# Patient Record
Sex: Female | Born: 1985 | Smoking: Never smoker
Health system: Southern US, Community
[De-identification: ages and names within clinical notes are randomized; demographics above are authoritative.]

## PROBLEM LIST (undated history)

## (undated) DIAGNOSIS — E079 Disorder of thyroid, unspecified: Secondary | ICD-10-CM

## (undated) HISTORY — PX: WISDOM TOOTH EXTRACTION: SHX21

---

## 2020-05-12 ENCOUNTER — Other Ambulatory Visit: Payer: Self-pay | Admitting: Family

## 2020-05-12 DIAGNOSIS — R102 Pelvic and perineal pain: Secondary | ICD-10-CM

## 2020-05-13 ENCOUNTER — Ambulatory Visit
Admission: RE | Admit: 2020-05-13 | Discharge: 2020-05-13 | Disposition: A | Discharge status: Home / Self Care | Payer: Managed Care, Other (non HMO) | Source: Ambulatory Visit | Attending: Family | Admitting: Family

## 2020-05-13 ENCOUNTER — Other Ambulatory Visit: Payer: Self-pay

## 2020-05-13 DIAGNOSIS — R102 Pelvic and perineal pain: Secondary | ICD-10-CM | POA: Insufficient documentation

## 2020-06-20 ENCOUNTER — Encounter: Payer: Self-pay | Admitting: Emergency Medicine

## 2020-06-20 ENCOUNTER — Other Ambulatory Visit: Payer: Self-pay

## 2020-06-20 ENCOUNTER — Ambulatory Visit
Admission: EM | Admit: 2020-06-20 | Discharge: 2020-06-20 | Disposition: A | Discharge status: Home / Self Care | Payer: Managed Care, Other (non HMO)

## 2020-06-20 DIAGNOSIS — W57XXXA Bitten or stung by nonvenomous insect and other nonvenomous arthropods, initial encounter: Secondary | ICD-10-CM | POA: Diagnosis not present

## 2020-06-20 DIAGNOSIS — S40861A Insect bite (nonvenomous) of right upper arm, initial encounter: Secondary | ICD-10-CM

## 2020-06-20 DIAGNOSIS — Z9189 Other specified personal risk factors, not elsewhere classified: Secondary | ICD-10-CM | POA: Diagnosis not present

## 2020-06-20 HISTORY — DX: Disorder of thyroid, unspecified: E07.9

## 2020-06-20 MED ORDER — DOXYCYCLINE HYCLATE 100 MG PO CAPS: 100.0000 mg | ORAL_CAPSULE | Freq: Two times a day (BID) | ORAL | 0 refills | Status: AC

## 2020-06-20 NOTE — ED Triage Notes (Signed)
Patient states that she removed a tick from her upper right arm this morning.  Patient states that she is have some aching in her right arm.

## 2020-06-20 NOTE — ED Provider Notes (Addendum)
[facility]    CSN: [id number] Arrival date & time: 06/20/20  1006      History   Chief Complaint Chief Complaint  Patient presents with   Insect Bite    tick    HPI Sabrina Flores is a 35 y.o. female.   HPI  35 year old female here for evaluation of tick bite.  Patient reports that she noticed a brown tick attached to the posterior aspect of her right upper arm that she removed this morning.  She did remove the tick intact and has brought it with her.  The tick appears to be a dog tick.  She is concerned because she is feeling aching in her right arm and occasionally will have tingling in all of her fingers.  She denies any weakness or changes in mobility.  She is here out of an abundance of precaution.  Past Medical History:  Diagnosis Date   Thyroid disease     There are no problems to display for this patient.   Past Surgical History:  Procedure Laterality Date   WISDOM TOOTH EXTRACTION      OB History   No obstetric history on file.      Home Medications    Prior to Admission medications   Medication Sig Start Date End Date Taking? Authorizing Provider  doxycycline (VIBRAMYCIN) 100 MG capsule Take 1 capsule (100 mg total) by mouth 2 (two) times daily. 06/20/20  Yes Becky Augusta, NP  levothyroxine (SYNTHROID) 88 MCG tablet One tab mon to fri 2 on sat and sunday 05/22/20  Yes [provider]  metFORMIN (GLUCOPHAGE-XR) 500 MG 24 hr tablet Take 500 mg by mouth daily. 06/17/20  Yes [provider]  Prenatal Vit-Fe Fumarate-FA (PNV PRENATAL PLUS MULTIVITAMIN) 27-1 MG TABS Take 1 tablet by mouth daily.   Yes [provider]    Family History History reviewed. No pertinent family history.  Social History Social History   Tobacco Use   Smoking status: Never   Smokeless tobacco: Never  Vaping Use   Vaping Use: Never used  Substance Use Topics   Alcohol use: Never   Drug use: Never     Allergies   Sulfa  antibiotics, Metformin, and Naproxen   Review of Systems Review of Systems  Constitutional:  Negative for activity change, appetite change and fever.  Musculoskeletal:  Positive for myalgias. Negative for arthralgias and joint swelling.  Skin:  Negative for rash.  Neurological:  Negative for headaches.  Hematological: Negative.   Psychiatric/Behavioral: Negative.      Physical Exam Triage Vital Signs ED Triage Vitals  Enc Vitals Group     BP --      Pulse --      Resp --      Temp --      Temp src --      SpO2 --      Weight 06/20/20 1016 135 lb (61.2 kg)     Height 06/20/20 1016 5\' 4"  (1.626 m)     Head Circumference --      Peak Flow --      Pain Score 06/20/20 1015 3     Pain Loc --      Pain Edu? --      Excl. in GC? --    No data found.  Updated Vital Signs BP 109/77 (BP Location: Left Arm)   Pulse 71   Temp 98.3 F (36.8 C) (Oral)   Resp 14   Ht 5\' 4"  (  1.626 m)   Wt 135 lb (61.2 kg)   SpO2 100%   Breastfeeding No   BMI 23.17 kg/m   Visual Acuity Right Eye Distance:   Left Eye Distance:   Bilateral Distance:    Right Eye Near:   Left Eye Near:    Bilateral Near:     Physical Exam Vitals and nursing note reviewed.  Constitutional:      General: She is not in acute distress.    Appearance: Normal appearance. She is normal weight. She is not ill-appearing.  HENT:     Head: Normocephalic and atraumatic.  Musculoskeletal:        General: No swelling, tenderness or deformity.  Skin:    General: Skin is warm and dry.     Capillary Refill: Capillary refill takes less than 2 seconds.     Findings: No bruising, erythema, lesion or rash.  Neurological:     General: No focal deficit present.     Mental Status: She is alert and oriented to person, place, and time.     Sensory: No sensory deficit.     Motor: No weakness.  Psychiatric:        Mood and Affect: Mood normal.        Behavior: Behavior normal.        Thought Content: Thought content  normal.        Judgment: Judgment normal.     UC Treatments / Results  Labs (all labs ordered are listed, but only abnormal results are displayed) Labs Reviewed - No data to display  EKG   Radiology No results found.  Procedures Procedures (including critical care time)  Medications Ordered in UC Medications - No data to display  Initial Impression / Assessment and Plan / UC Course  I have reviewed the triage vital signs and the nursing notes.  Pertinent labs & imaging results that were available during my care of the patient were reviewed by me and considered in my medical decision making (see chart for details).  Patient is a very pleasant, nontoxic-appearing 35 year old female here for evaluation after finding a tick on her this morning.  She is unsure of the duration she says it could have been several hours overnight.  There is no redness at the site.  The tick has been removed intact that she has not with her.  There is no rash, joint pain, headache, fever, or malaise.  Patient does report that her right arm aches and she will occasionally get tingling in her fingers.  Patient has 5/5 bilateral grips and upper extremity strength.  There is no pain with passive or active range of motion of any of the joints of her right arm.  There is no localized erythema or histamine response at the site of the bite.  Out of an abundance of precaution will cover patient prophylactically with doxycycline.   Final Clinical Impressions(s) / UC Diagnoses   Final diagnoses:  At high risk for tick borne illness  Tick bite of right upper arm, initial encounter     Discharge Instructions      Take the doxycycline twice daily with food for 10 days.  The doxycycline will make you more photosensitive and prone to sunburn make sure that you are wearing sunscreen when you are outdoors and make sure that you are applying it to your ears and also the part of your hair.  Reapply the sunscreen every  2 hours while outdoors.  If you develop any rashes,  joint pain, fever, muscle pain, or fatigue return for reevaluation.     ED Prescriptions     Medication Sig Dispense Auth. Provider   doxycycline (VIBRAMYCIN) 100 MG capsule Take 1 capsule (100 mg total) by mouth 2 (two) times daily. 20 capsule Becky Augusta, NP      PDMP not reviewed this encounter.   Becky Augusta, NP 06/20/20 1038    Becky Augusta, NP 06/20/20 1042

## 2020-06-20 NOTE — Discharge Instructions (Addendum)
Take the doxycycline twice daily with food for 10 days.  The doxycycline will make you more photosensitive and prone to sunburn make sure that you are wearing sunscreen when you are outdoors and make sure that you are applying it to your ears and also the part of your hair.  Reapply the sunscreen every 2 hours while outdoors.  If you develop any rashes, joint pain, fever, muscle pain, or fatigue return for reevaluation.

## 2022-04-28 IMAGING — US US PELVIS COMPLETE WITH TRANSVAGINAL
1 series · 14 of 25 positions shown · non-contrast
Comparison: None

CLINICAL DATA: Pelvic pain in a female for 1.5 weeks, LMP [DATE]

EXAM:
TRANSABDOMINAL AND TRANSVAGINAL ULTRASOUND OF PELVIS
TECHNIQUE: Both transabdominal and transvaginal ultrasound examinations of the
pelvis were performed. Transabdominal technique was performed for
global imaging of the pelvis including uterus, ovaries, adnexal
regions, and pelvic cul-de-sac. It was necessary to proceed with
endovaginal exam following the transabdominal exam to visualize the
endometrium and adnexa.

[Series 1: us pelvis complete with transvaginal · 0.21mm/px · 14 of 53 slices shown]
[im 1/53]
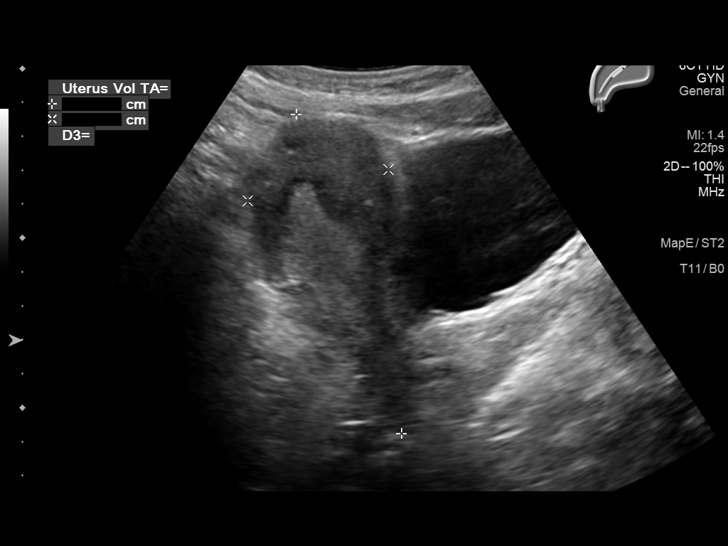
[im 5/53]
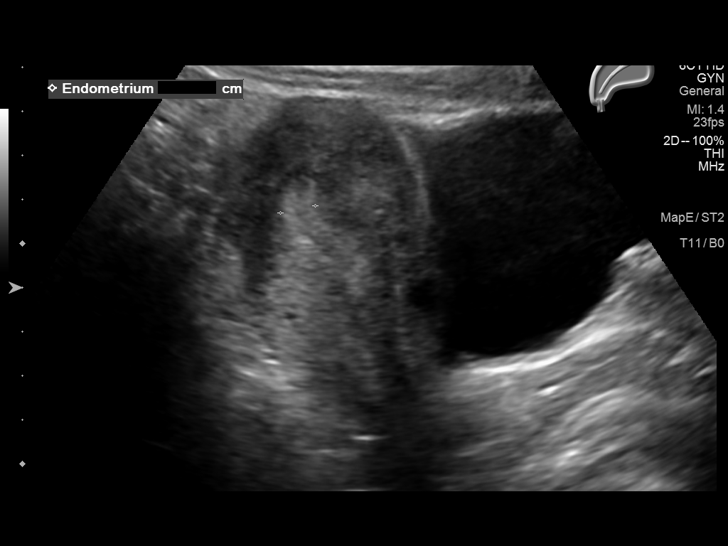
[im 9/53]
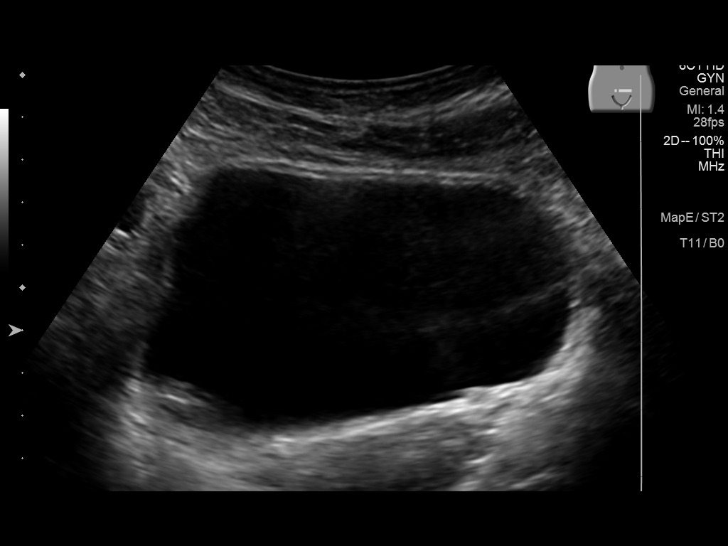
[im 14/53]
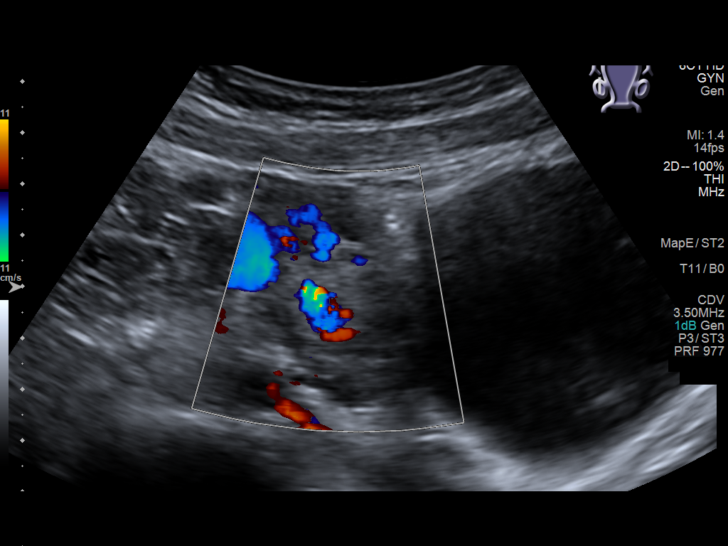
[im 18/53]
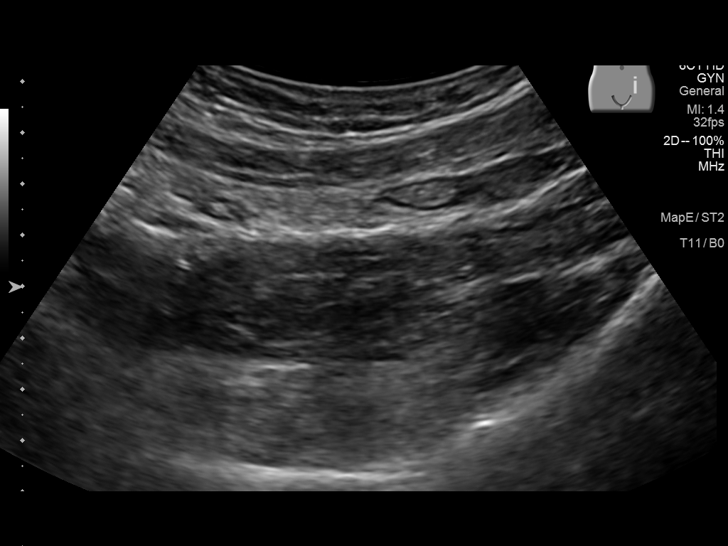
[im 20/53]
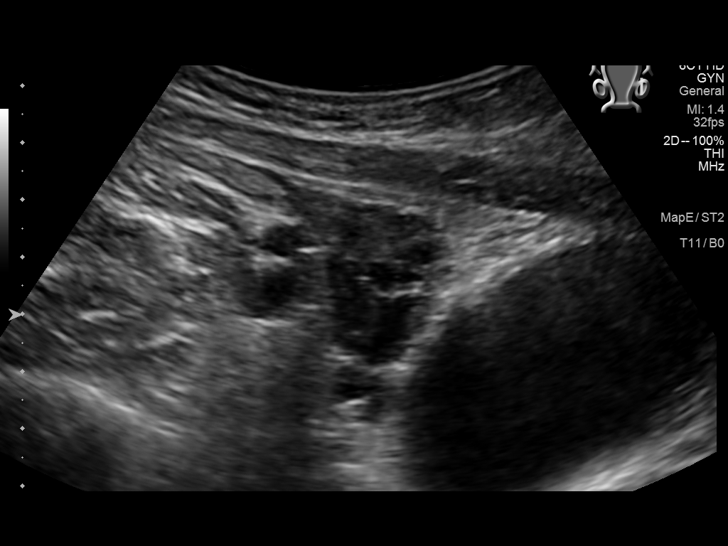
[im 24/53]
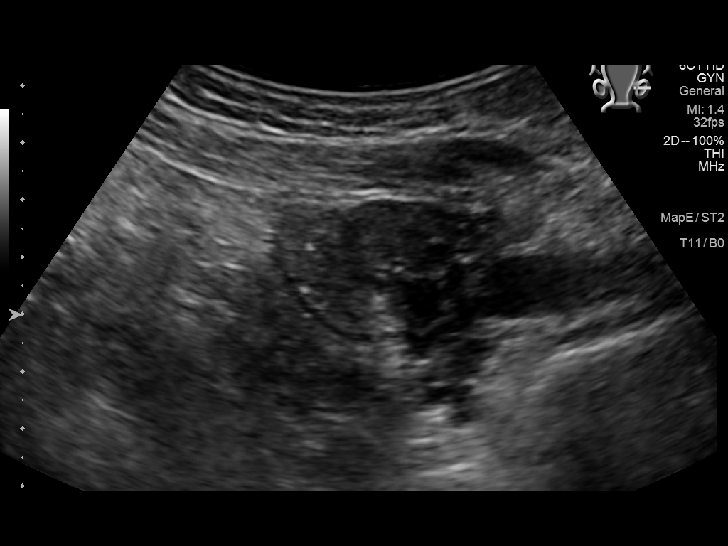
[im 29/53]
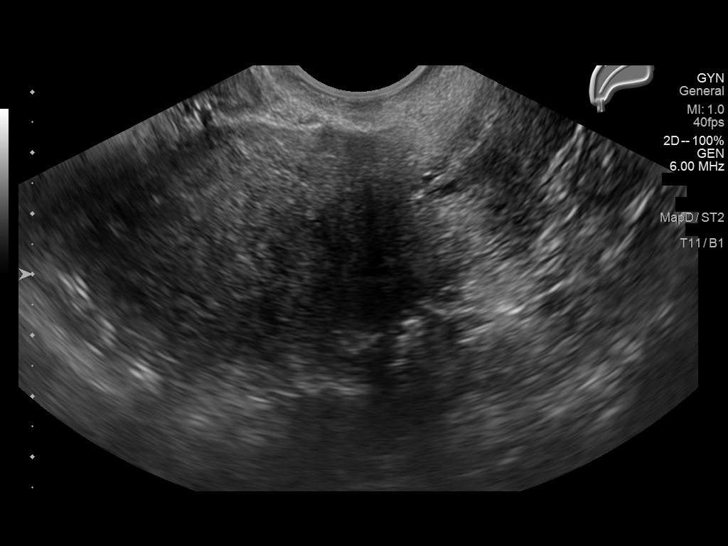
[im 33/53]
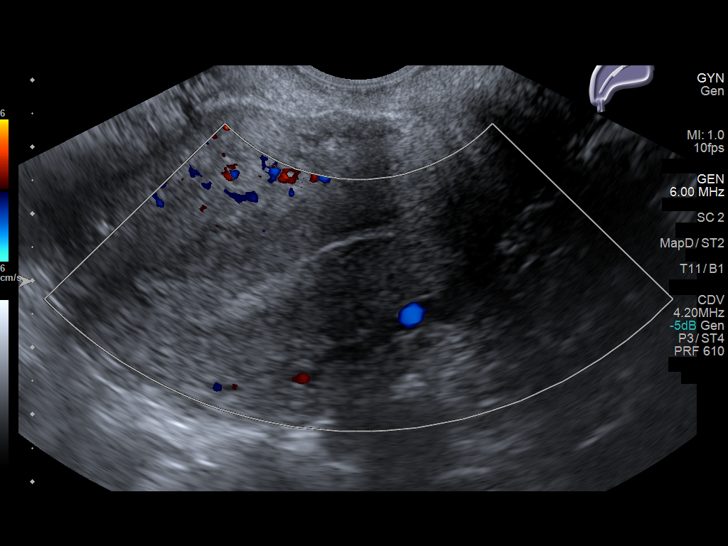
[im 35/53]
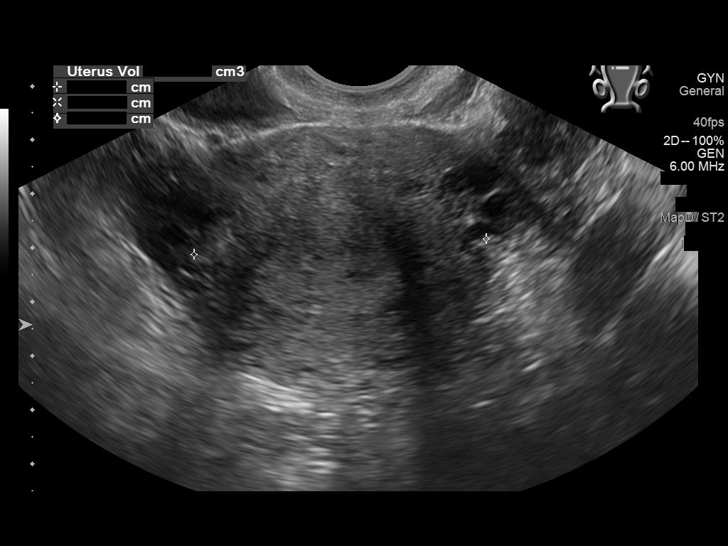
[im 40/53]
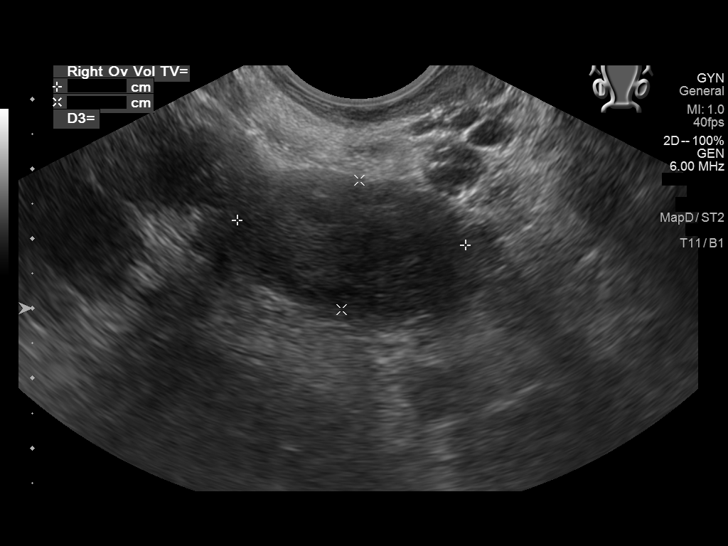
[im 44/53]
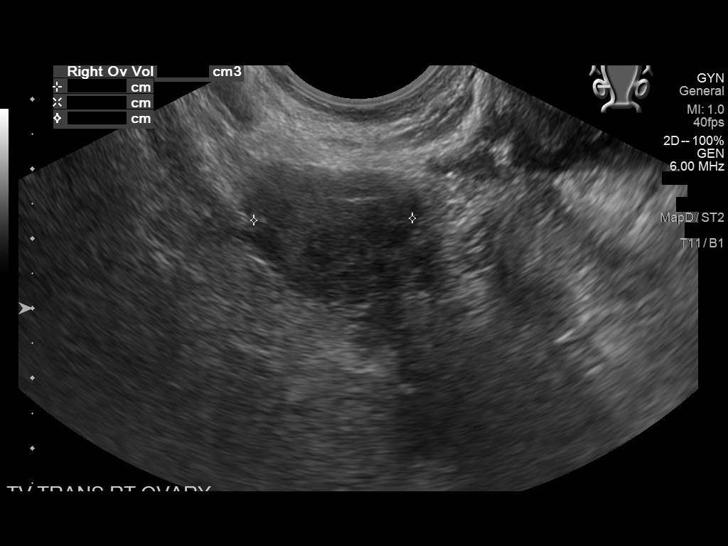
[im 48/53]
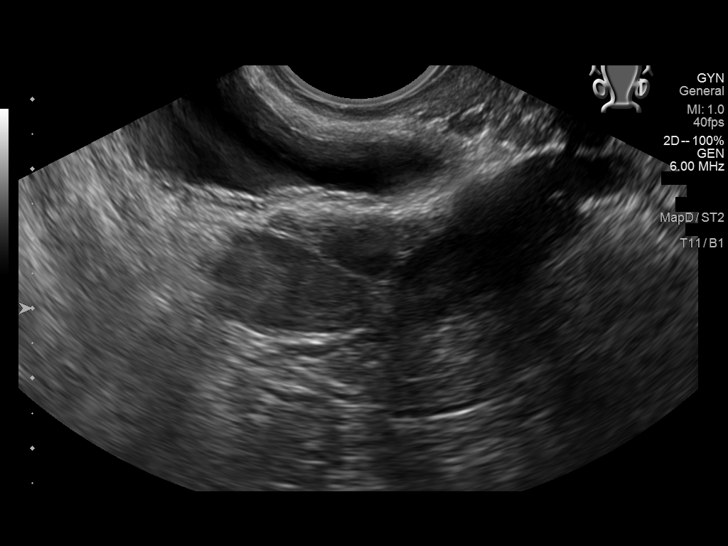
[im 53/53]
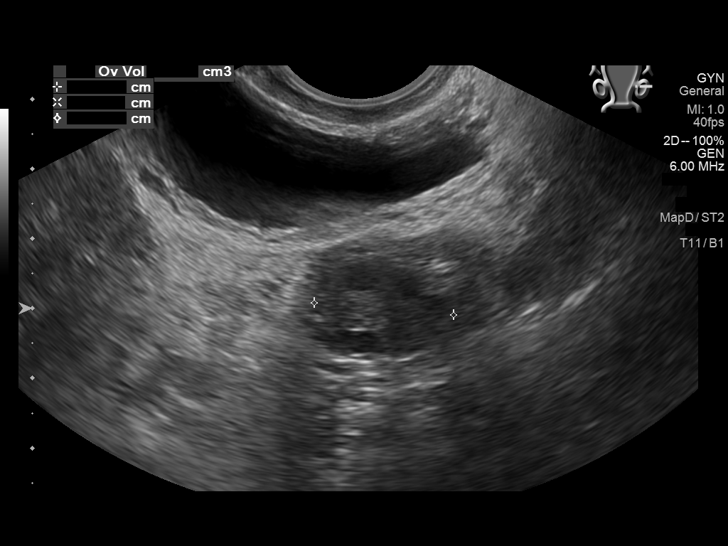

[14 of 25 positions shown; findings below may reference images not displayed]

FINDINGS: Uterus

Measurements: 8.7 x 4.5 x 5.4 cm = volume: 112 mL. Heterogeneous
myometrium. Anteverted. No focal mass.

Endometrium

Thickness: 9 mm.  No endometrial fluid or focal abnormality

Right ovary

Measurements: 3.3 x 1.9 x 2.3 cm = volume: 7.3 mL. Normal morphology
without mass

Left ovary

Measurements: 2.7 x 1.4 x 2.0 cm = volume: 4.0 mL. Normal morphology
without mass

Other findings

No free pelvic fluid. No adnexal masses. Dilated veins in LEFT
adnexa.
IMPRESSION: Unremarkable uterus, endometrial complex, and ovaries.

Dilated veins in LEFT adnexa, nonspecific but can be seen with
pelvic congestion syndrome.
